# Patient Record
Sex: Male | Born: 1985 | Race: Asian | Hispanic: No | Marital: Married | Smoking: Never smoker
Health system: Southern US, Community
[De-identification: ages and names within clinical notes are randomized; demographics above are authoritative.]

## PROBLEM LIST (undated history)

## (undated) HISTORY — PX: HERNIA REPAIR: SHX51

---

## 2019-08-03 ENCOUNTER — Ambulatory Visit: Payer: Self-pay | Attending: Family

## 2019-08-03 DIAGNOSIS — Z23 Encounter for immunization: Secondary | ICD-10-CM

## 2019-08-03 NOTE — Progress Notes (Signed)
   Covid-19 Vaccination Clinic  Name:  Juan Stanley    MRN: [id number] DOB: 01-Nov-1985  08/03/2019  [name] was observed post Covid-19 immunization for 15 minutes without incident. He was provided with Vaccine Information Sheet and instruction to access the V-Safe system.   [name] was instructed to call 911 with any severe reactions post vaccine: Marland Kitchen Difficulty breathing  . Swelling of face and throat  . A fast heartbeat  . A bad rash all over body  . Dizziness and weakness   Immunizations Administered    Name Date Dose VIS Date Route   Moderna COVID-19 Vaccine 08/03/2019 11:55 AM 0.5 mL 04/04/2019 Intramuscular   Manufacturer: Moderna   Lot: [id number]   NDC: 69629-528-41

## 2019-09-05 ENCOUNTER — Ambulatory Visit: Payer: Self-pay | Attending: Family

## 2019-09-05 DIAGNOSIS — Z23 Encounter for immunization: Secondary | ICD-10-CM

## 2019-09-05 NOTE — Progress Notes (Signed)
   Covid-19 Vaccination Clinic  Name:  Juan Stanley    MRN: [id number] DOB: 02/03/1986  09/05/2019  [name] was observed post Covid-19 immunization for 15 minutes without incident. He was provided with Vaccine Information Sheet and instruction to access the V-Safe system.   [name] was instructed to call 911 with any severe reactions post vaccine: Marland Kitchen Difficulty breathing  . Swelling of face and throat  . A fast heartbeat  . A bad rash all over body  . Dizziness and weakness   Immunizations Administered    Name Date Dose VIS Date Route   Moderna COVID-19 Vaccine 09/05/2019 12:30 PM 0.5 mL 04/2019 Intramuscular   Manufacturer: Moderna   Lot: [id number]   NDC: 62947-654-65

## 2020-07-25 ENCOUNTER — Emergency Department (HOSPITAL_COMMUNITY)
Admission: EM | Admit: 2020-07-25 | Discharge: 2020-07-25 | Disposition: A | Discharge status: Home / Self Care | Payer: BC Managed Care – PPO | Attending: Emergency Medicine | Admitting: Emergency Medicine

## 2020-07-25 ENCOUNTER — Encounter (HOSPITAL_COMMUNITY): Payer: Self-pay

## 2020-07-25 ENCOUNTER — Other Ambulatory Visit: Payer: Self-pay

## 2020-07-25 ENCOUNTER — Emergency Department (HOSPITAL_COMMUNITY): Payer: BC Managed Care – PPO

## 2020-07-25 DIAGNOSIS — K6289 Other specified diseases of anus and rectum: Secondary | ICD-10-CM | POA: Insufficient documentation

## 2020-07-25 LAB — BASIC METABOLIC PANEL
Anion gap: 7 (ref 5–15)
BUN: 11 mg/dL (ref 6–20)
CO2: 27 mmol/L (ref 22–32)
Calcium: 9.3 mg/dL (ref 8.9–10.3)
Chloride: 100 mmol/L (ref 98–111)
Creatinine, Ser: 1 mg/dL (ref 0.61–1.24)
GFR, Estimated: >60 mL/min (ref >60)
Glucose, Bld: 97 mg/dL (ref 70–99)
Potassium: 3.6 mmol/L (ref 3.5–5.1)
Sodium: 134 mmol/L — ABNORMAL LOW (ref 135–145)

## 2020-07-25 LAB — CBC WITH DIFFERENTIAL/PLATELET
Abs Immature Granulocytes: 0.02 10*3/uL (ref 0.00–0.07)
Basophils Absolute: 0 10*3/uL (ref 0.0–0.1)
Basophils Relative: 0 %
Eosinophils Absolute: 0.3 10*3/uL (ref 0.0–0.5)
Eosinophils Relative: 5 %
HCT: 47.1 % (ref 39.0–52.0)
Hemoglobin: 15.8 g/dL (ref 13.0–17.0)
Immature Granulocytes: 0 %
Lymphocytes Relative: 31 %
Lymphs Abs: 2.1 10*3/uL (ref 0.7–4.0)
MCH: 29.9 pg (ref 26.0–34.0)
MCHC: 33.5 g/dL (ref 30.0–36.0)
MCV: 89 fL (ref 80.0–100.0)
Monocytes Absolute: 0.5 10*3/uL (ref 0.1–1.0)
Monocytes Relative: 7 %
Neutro Abs: 3.8 10*3/uL (ref 1.7–7.7)
Neutrophils Relative %: 57 %
Platelets: 295 10*3/uL (ref 150–400)
RBC: 5.29 MIL/uL (ref 4.22–5.81)
RDW: 12.6 % (ref 11.5–15.5)
WBC: 6.8 10*3/uL (ref 4.0–10.5)
nRBC: 0 % (ref 0.0–0.2)

## 2020-07-25 MED ORDER — LIDOCAINE 5 % EX OINT: 1.0000 "application " | TOPICAL_OINTMENT | CUTANEOUS | 0 refills | Status: AC | PRN

## 2020-07-25 MED ORDER — IOHEXOL 300 MG/ML  SOLN
100.0000 mL | Freq: Once | INTRAMUSCULAR | Status: AC | PRN
  Administered 2020-07-25: 100 mL via INTRAVENOUS

## 2020-07-25 MED ORDER — DOXYCYCLINE HYCLATE 100 MG PO CAPS: 100.0000 mg | ORAL_CAPSULE | Freq: Two times a day (BID) | ORAL | 0 refills | Status: AC

## 2020-07-25 NOTE — ED Notes (Signed)
Per [facility] NP, patient with rectal pain for over a week-states possible perirectal abscess

## 2020-07-25 NOTE — Discharge Instructions (Signed)
There was a small area of inflammation noted in the CT of the pelvis. Take medications as prescribed along with a stool softener. Follow-up with your primary care provider. Return to the ER if you start to experience worsening pain, swelling, bloody stools.

## 2020-07-25 NOTE — ED Triage Notes (Signed)
Patient c/o rectal pain x 8 days. 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 Patient states he received a cream, but continues to have hemorrhoids. Patient denies any rectal bleeding.

## 2020-07-25 NOTE — ED Provider Notes (Signed)
[facility]-EMERGENCY DEPT Provider Note   CSN: [id number] Arrival date & time: 07/25/20  1705     History Chief Complaint  Patient presents with  . Rectal Pain    Juan Stanley is a 35 y.o. male who presents to ED with a chief complaint of rectal pain.  States that he has had intermittent less severe rectal pain for the past several years.  The pain will improve spontaneously.  He noticed for the past week that his pain has gotten severe and worse when he has a bowel movement.  He was seen and evaluated at student health center and received a cream and suppositories.  He continues to have pain and was sent to the ER to rule out abscess.  He states that his pain is most severe several hours after having a bowel movement.  He last used the cream just prior to arrival.  He denies any bloody stools, abdominal pain, chest pain, fever, drainage from the area, urinary symptoms.  Denies any chronic medical issues.  HPI     History reviewed. No pertinent past medical history.  There are no problems to display for this patient.   Past Surgical History:  Procedure Laterality Date  . HERNIA REPAIR         Family History  Problem Relation Age of Onset  . Rheum arthritis Mother   . Healthy Father     Social History   Tobacco Use  . Smoking status: Never Smoker  . Smokeless tobacco: Never Used  Vaping Use  . Vaping Use: Never used  Substance Use Topics  . Alcohol use: Yes  . Drug use: Never    Home Medications Prior to Admission medications   Medication Sig Start Date End Date Taking? Authorizing Provider  doxycycline (VIBRAMYCIN) 100 MG capsule Take 1 capsule (100 mg total) by mouth 2 (two) times daily for 7 days. 07/25/20 08/01/20 Yes Jomari Bartnik, PA-C  lidocaine (XYLOCAINE) 5 % ointment Apply 1 application topically as needed. 07/25/20  Yes Areal Cochrane, PA-C    Allergies    Patient has no known allergies.  Review of Systems   Review of  Systems  Constitutional: Negative for appetite change, chills and fever.  HENT: Negative for ear pain, rhinorrhea, sneezing and sore throat.   Eyes: Negative for photophobia and visual disturbance.  Respiratory: Negative for cough, chest tightness, shortness of breath and wheezing.   Cardiovascular: Negative for chest pain and palpitations.  Gastrointestinal: Negative for abdominal pain, blood in stool, constipation, diarrhea, nausea and vomiting.       +rectal pain  Genitourinary: Negative for dysuria, hematuria and urgency.  Musculoskeletal: Negative for myalgias.  Skin: Negative for rash.  Neurological: Negative for dizziness, weakness and light-headedness.    Physical Exam Updated Vital Signs BP (!) 140/91   Pulse 80   Temp 98.2 F (36.8 C)   Resp (!) 25   Ht 5\' 9"  (1.753 m)   Wt 77.1 kg   SpO2 100%   BMI 25.10 kg/m   Physical Exam Vitals and nursing note reviewed. Exam conducted with a chaperone present.  Constitutional:      General: He is not in acute distress.    Appearance: He is well-developed.  HENT:     Head: Normocephalic and atraumatic.     Nose: Nose normal.  Eyes:     General: No scleral icterus.       Left eye: No discharge.     Conjunctiva/sclera: Conjunctivae normal.  Cardiovascular:     Rate and Rhythm: Normal rate and regular rhythm.     Heart sounds: Normal heart sounds. No murmur heard. No friction rub. No gallop.   Pulmonary:     Effort: Pulmonary effort is normal. No respiratory distress.     Breath sounds: Normal breath sounds.  Abdominal:     General: Bowel sounds are normal. There is no distension.     Palpations: Abdomen is soft.     Tenderness: There is no abdominal tenderness. There is no guarding.  Genitourinary:    Comments: No external abnormalities noted.  Patient with severe discomfort when attempting DRE. I was unable to fully palpate rectal vault. RN as chaperone at the bedside. Musculoskeletal:        General: Normal range  of motion.     Cervical back: Normal range of motion and neck supple.  Skin:    General: Skin is warm and dry.     Findings: No rash.  Neurological:     Mental Status: He is alert.     Motor: No abnormal muscle tone.     Coordination: Coordination normal.     ED Results / Procedures / Treatments   Labs (all labs ordered are listed, but only abnormal results are displayed) Labs Reviewed  BASIC METABOLIC PANEL - Abnormal; Notable for the following components:      Result Value   Sodium 134 (*)    All other components within normal limits  CBC WITH DIFFERENTIAL/PLATELET    EKG None  Radiology CT PELVIS W CONTRAST  Result Date: 07/25/2020 CLINICAL DATA:  Perirectal pain EXAM: CT PELVIS WITH CONTRAST TECHNIQUE: Multidetector CT imaging of the pelvis was performed using the standard protocol following the bolus administration of intravenous contrast. CONTRAST:  OMNIPAQUE IOHEXOL 300 MG/ML  SOLN COMPARISON:  None. FINDINGS: Urinary Tract: Urinary bladder is midline with wall thickness within normal limits. No lesion seen by CT in the distributions of the mid to distal ureters. Bowel: No appreciable bowel wall thickening. There is moderate stool in the rectum and sigmoid colon. No bowel obstruction evident. Terminal ileum appears normal. Appendix appears normal. No evident free air in the lower abdomen or pelvis. Vascular/Lymphatic: Pelvic vascular structures appear normal. No evident adenopathy in the lower abdomen or pelvis. Reproductive: Prostate and seminal vesicles are normal in size and contour. Other: There is subtle soft tissue thickening to the left of the rectum which is appreciable on axial images but not well delineated on sagittal and coronal reformats. This area of asymmetric thickening measures 2.0 x 1.6 cm on axial images. There is no fluid collection or well-defined abscess. There is no appreciable soft tissue stranding apart from this localized thickening on the left.  Elsewhere in the lower abdomen and pelvis, there is no ascites or abscess. There is fat in the left inguinal ring. Musculoskeletal: No blastic or lytic bone lesions. No fracture or dislocation. No intramuscular lesions. IMPRESSION: 1. No rectal wall thickening. No well-defined abscess. On axial images, there is subtle soft tissue asymmetry slightly to the left of the rectum without associated fluid. This finding is not confirmed on coronal and sagittal images and is of questionable significance. Early inflammation in this area is possible. The area of asymmetry seen on axial images measures 2.0 x 1.6 cm. This asymmetry is noted on axial slices 35 and 36 series 2. 2. No other soft tissue asymmetry noted. No evident bowel wall thickening or bowel obstruction. No abscess in the lower abdomen  or pelvis. The appendix appears normal. 3.  Fat in the left inguinal ring. 4.  Study otherwise unremarkable. Electronically Signed   By: Bretta Bang III M.D.   On: 07/25/2020 19:14    Procedures Procedures   Medications Ordered in ED Medications  iohexol (OMNIPAQUE) 300 MG/ML solution 100 mL (100 mLs Intravenous Contrast Given 07/25/20 1852)    ED Course  I have reviewed the triage vital signs and the nursing notes.  Pertinent labs & imaging results that were available during my care of the patient were reviewed by me and considered in my medical decision making (see chart for details).  Clinical Course as of 07/25/20 1929  Thu Jul 25, 2020  1836 WBC: 6.8 [HK]  1836 Creatinine: 1.00 [HK]    Clinical Course User Index [HK] Dietrich Pates, PA-C   MDM Rules/Calculators/A&P                          35 year old male presenting to the ED with a chief complaint of rectal pain.  Has been treating for hemorrhoids for the past week after being seen at student health center.  However his pain worsened today and he states that it is significantly worse after he has a bowel movement.  He was sent to the ER to rule  out possible abscess.  He reports severe pain with attempting DRE.  He is afebrile here.  Denies any bloody stools, abdominal pain, fever or drainage.  Will obtain lab work and CT of the pelvis.  Lab work is reassuring.  CT of the pelvis with possible inflammation of the left of the rectum without evidence of abscess or other abnormality.  Due to patient's persistent symptoms and exquisite tenderness, will treat with antibiotics after having shared decision-making discussion.  We will also give him lidocaine to help with discomfort.  He was advised to continue taking his stool softener.  Return precautions given.   Patient is hemodynamically stable, in NAD, and able to ambulate in the ED. Evaluation does not show pathology that would require ongoing emergent intervention or inpatient treatment. I explained the diagnosis to the patient. Pain has been managed and has no complaints prior to discharge. Patient is comfortable with above plan and is stable for discharge at this time. All questions were answered prior to disposition. Strict return precautions for returning to the ED were discussed. Encouraged follow up with PCP.   An After Visit Summary was printed and given to the patient.   Portions of this note were generated with Scientist, clinical (histocompatibility and immunogenetics). Dictation errors may occur despite best attempts at proofreading.  Final Clinical Impression(s) / ED Diagnoses Final diagnoses:  Rectal pain    Rx / DC Orders ED Discharge Orders         Ordered    lidocaine (XYLOCAINE) 5 % ointment  As needed        07/25/20 1928    doxycycline (VIBRAMYCIN) 100 MG capsule  2 times daily        07/25/20 1928           Dietrich Pates, [place] 07/25/20 1929    Linwood Dibbles, MD 07/26/20 1506

## 2020-07-31 ENCOUNTER — Telehealth: Payer: Self-pay | Admitting: Internal Medicine

## 2020-07-31 NOTE — Telephone Encounter (Signed)
Patient seen at [facility] [employer] [facility] because of rectal pain, abscess seems to have been ruled out by CT scan in the ER but he has persistent symptoms.  Student health requesting appointment for this patient.  I have an opening Thursday, March 31 at 930  Call him and see if he can do that let me know if not we will look for another spot

## 2020-08-01 NOTE — Telephone Encounter (Signed)
Patient has been scheduled to see Willette Cluster RNP on 08/05/20 1:30.  Offered earlier appointment , but he declined

## 2020-08-02 ENCOUNTER — Other Ambulatory Visit: Payer: Self-pay

## 2020-08-05 ENCOUNTER — Ambulatory Visit: Payer: BC Managed Care – PPO | Admitting: Nurse Practitioner

## 2020-08-05 NOTE — Progress Notes (Deleted)
     ASSESSMENT AND PLAN      HISTORY OF PRESENT ILLNESS     Chief Complaint :  Juan Stanley is a 35 y.o. male known to Dr. Marland Kitchen with a past medical history significant for  Data Reviewed:   PREVIOUS EVALUATIONS:    No past medical history on file.   Past Surgical History:  Procedure Laterality Date  . HERNIA REPAIR     Family History  Problem Relation Age of Onset  . Rheum arthritis Mother   . Healthy Father    Social History   Tobacco Use  . Smoking status: Never Smoker  . Smokeless tobacco: Never Used  Vaping Use  . Vaping Use: Never used  Substance Use Topics  . Alcohol use: Yes  . Drug use: Never   Current Outpatient Medications  Medication Sig Dispense Refill  . ANUSOL-HC 25 MG suppository Insert 1 suppository into rectum twice a day as needed for pain    . hydrocortisone (ANUSOL-HC) 2.5 % rectal cream USE AS DIRECTED VIA APPLICATOR TWICE A DAY    . lidocaine (XYLOCAINE) 5 % ointment Apply 1 application topically as needed. 35.44 g 0   No current facility-administered medications for this visit.   No Known Allergies   Review of Systems: Positive for ***.  All other systems reviewed and negative except where noted in HPI.   PHYSICAL EXAM :    Wt Readings from Last 3 Encounters:  07/25/20 170 lb (77.1 kg)    There were no vitals taken for this visit. Constitutional:  Pleasant ***male in no acute distress. Psychiatric: Normal mood and affect. Behavior is normal. EENT: Pupils normal.  Conjunctivae are normal. No scleral icterus. Neck supple.  Cardiovascular: Normal rate, regular rhythm. No edema Pulmonary/chest: Effort normal and breath sounds normal. No wheezing, rales or rhonchi. Abdominal: Soft, nondistended, nontender. Bowel sounds active throughout. There are no masses palpable. No hepatomegaly. Neurological: Alert and oriented to person place and time. Skin: Skin is warm and dry. No rashes noted.  Willette Cluster, NP  08/05/2020,  1:30 PM

## 2020-09-10 ENCOUNTER — Ambulatory Visit: Payer: BC Managed Care – PPO | Attending: Internal Medicine

## 2020-09-10 DIAGNOSIS — Z20822 Contact with and (suspected) exposure to covid-19: Secondary | ICD-10-CM

## 2020-09-11 LAB — NOVEL CORONAVIRUS, NAA: SARS-CoV-2, NAA: NOT DETECTED

## 2020-09-11 LAB — SARS-COV-2, NAA 2 DAY TAT

## 2021-10-31 IMAGING — CT CT PELVIS W/ CM
2 of 4 series · 16 of 46 positions shown, 18 images · IV contrast (omnipaque)
Comparison: None.

CLINICAL DATA: Perirectal pain

EXAM:
CT PELVIS WITH CONTRAST
TECHNIQUE: Multidetector CT imaging of the pelvis was performed using the
standard protocol following the bolus administration of intravenous
contrast.
CONTRAST:  100mL OMNIPAQUE IOHEXOL 300 MG/ML  SOLN

[Series 2: axial st · axial · 0.92mm/px · z∈[+960,+1260]mm · 13 of 70 slices shown, 15 images]
[im 5/70  soft-tissue]
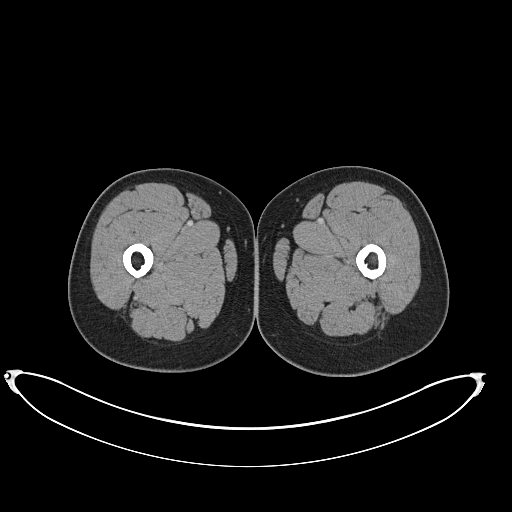
[im 5/70  bone]
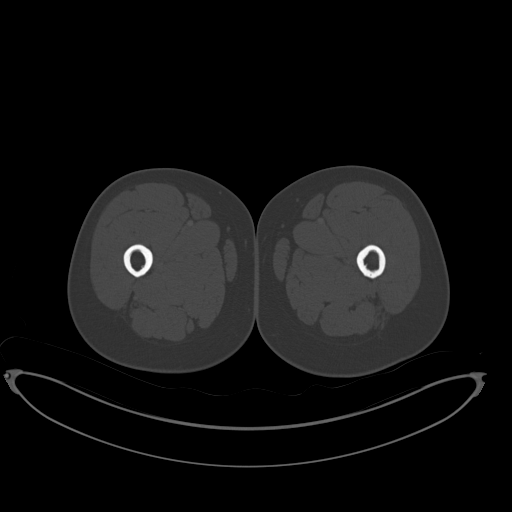
[im 9/70  soft-tissue]
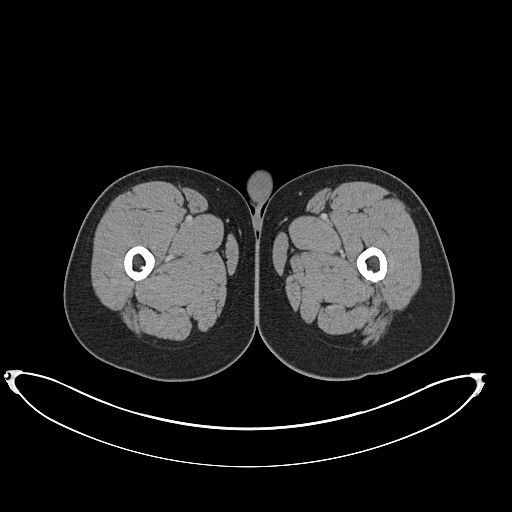
[im 14/70  soft-tissue]
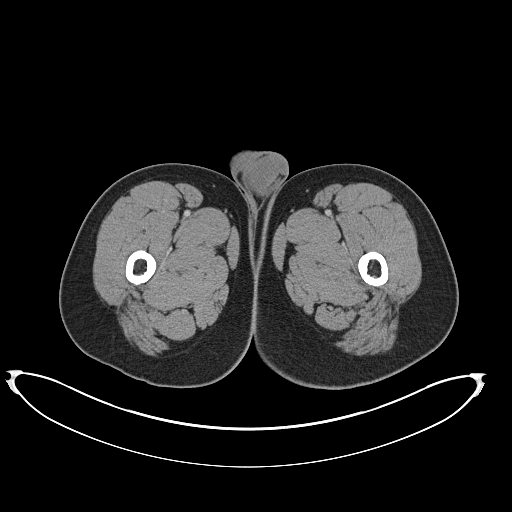
[im 21/70  soft-tissue]
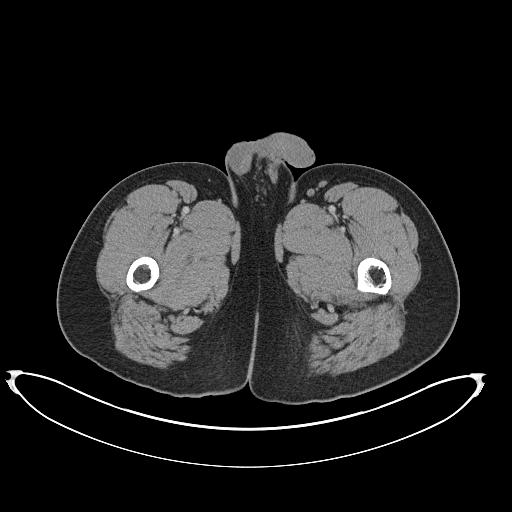
[im 25/70  soft-tissue]
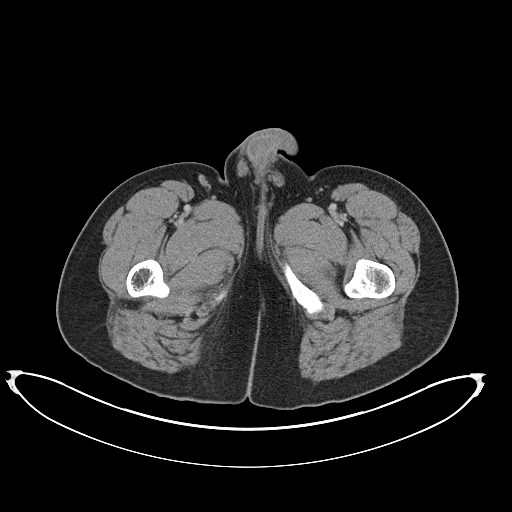
[im 29/70  soft-tissue]
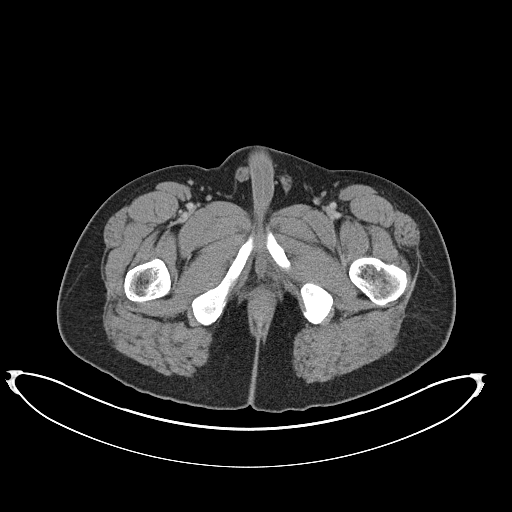
[im 36/70  soft-tissue]
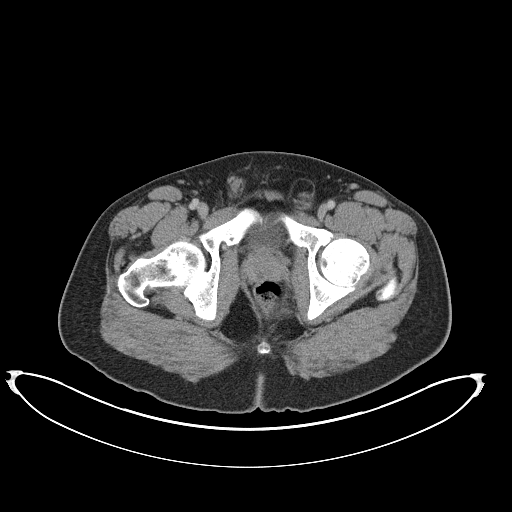
[im 41/70  soft-tissue]
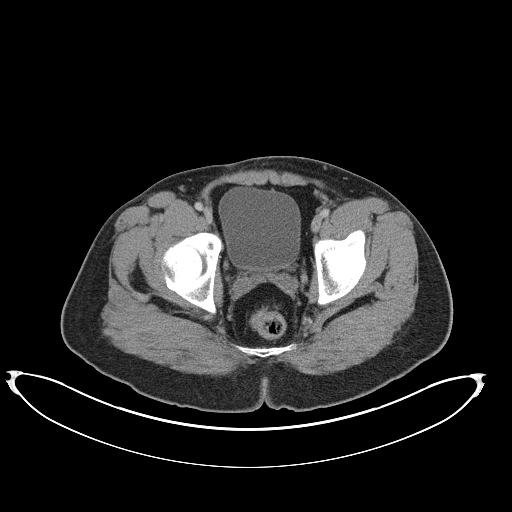
[im 45/70  soft-tissue]
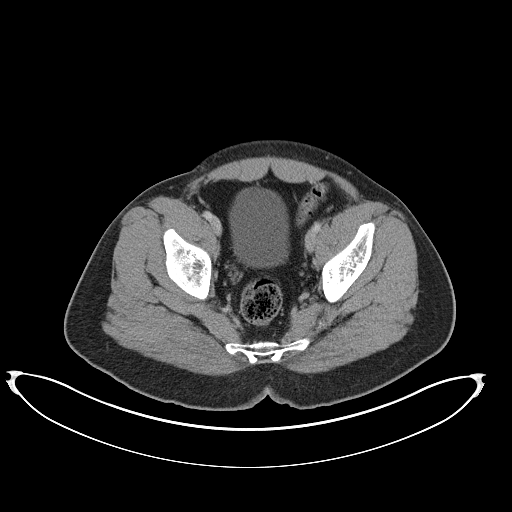
[im 45/70  bone]
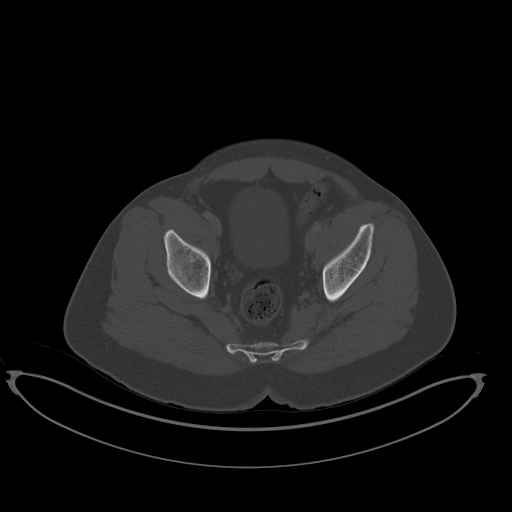
[im 49/70  soft-tissue]
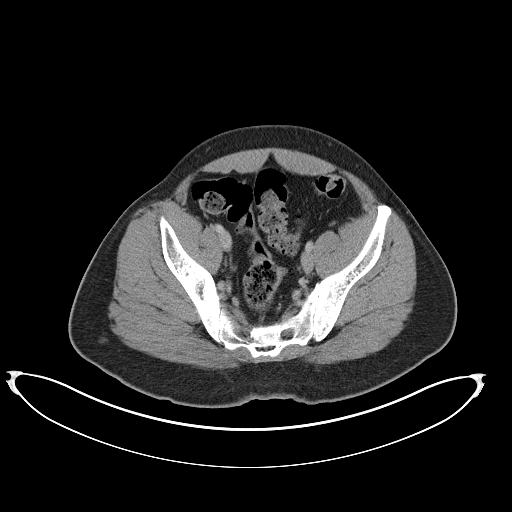
[im 56/70  soft-tissue]
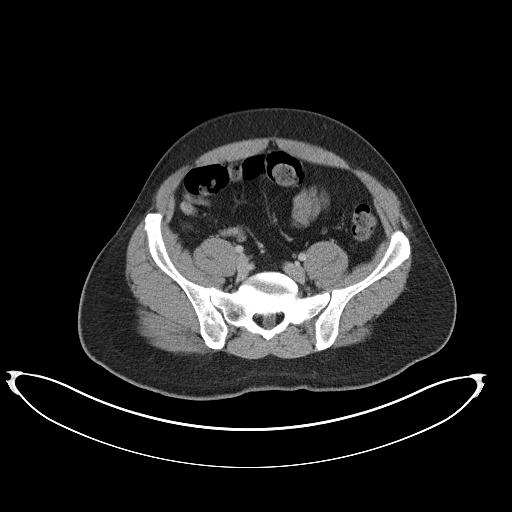
[im 61/70  soft-tissue]
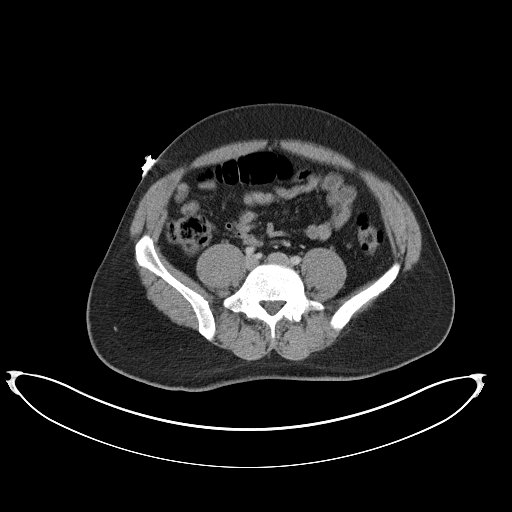
[im 65/70  soft-tissue]
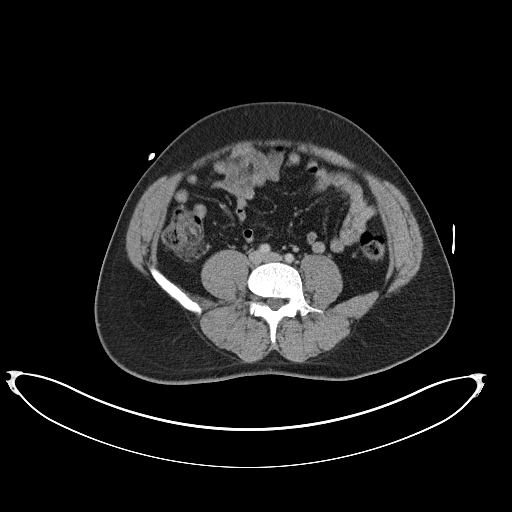

[Series 8: coronal st · coronal · 0.74mm/px · 3 of 161 slices shown]
[im 33/161  soft-tissue]
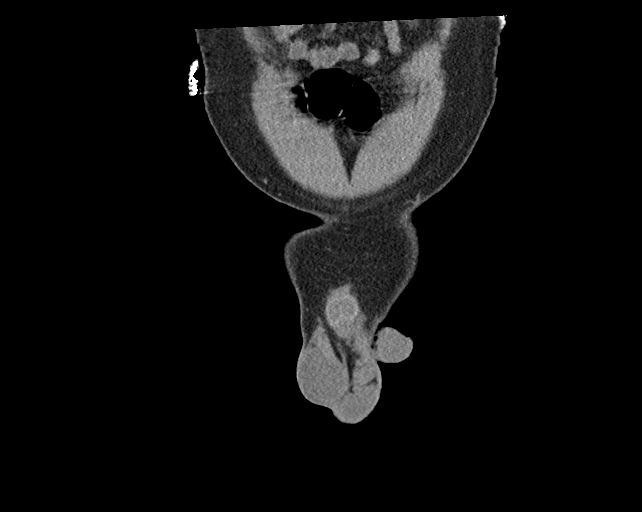
[im 65/161  soft-tissue]
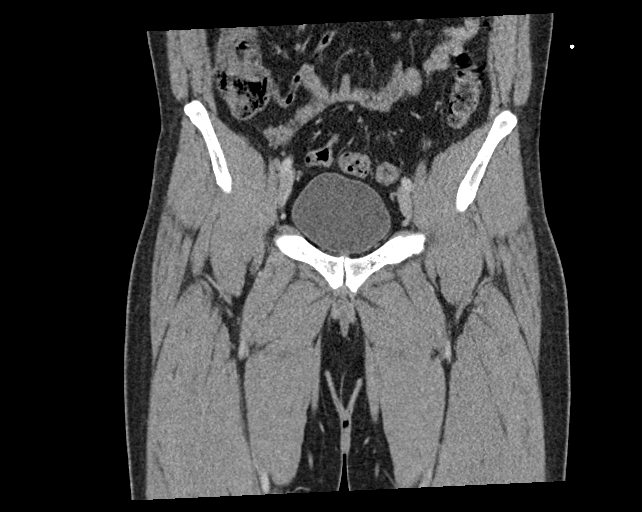
[im 97/161  soft-tissue]
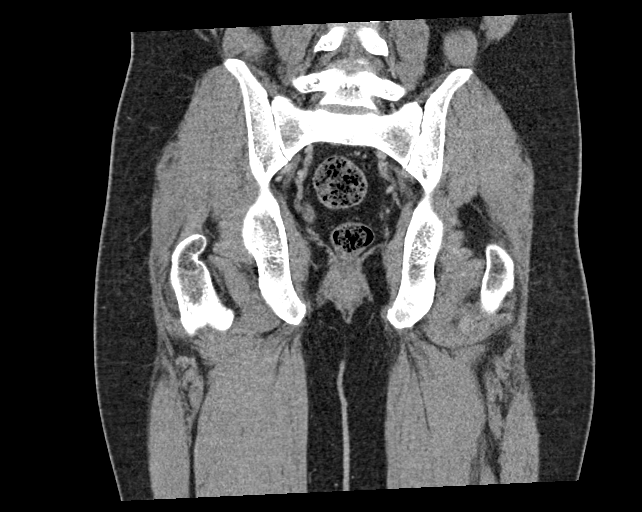

[16 of 46 positions shown; findings below may reference images not displayed]

FINDINGS: Urinary Tract: Urinary bladder is midline with wall thickness within
normal limits. No lesion seen by CT in the distributions of the mid
to distal ureters.

Bowel: No appreciable bowel wall thickening. There is moderate stool
in the rectum and sigmoid colon. No bowel obstruction evident.
Terminal ileum appears normal. Appendix appears normal. No evident
free air in the lower abdomen or pelvis.

Vascular/Lymphatic: Pelvic vascular structures appear normal. No
evident adenopathy in the lower abdomen or pelvis.

Reproductive: Prostate and seminal vesicles are normal in size and
contour.

Other: There is subtle soft tissue thickening to the left of the
rectum which is appreciable on axial images but not well delineated
on sagittal and coronal reformats. This area of asymmetric
thickening measures 2.0 x 1.6 cm on axial images. There is no fluid
collection or well-defined abscess. There is no appreciable soft
tissue stranding apart from this localized thickening on the left.

Elsewhere in the lower abdomen and pelvis, there is no ascites or
abscess. There is fat in the left inguinal ring.

Musculoskeletal: No blastic or lytic bone lesions. No fracture or
dislocation. No intramuscular lesions.
IMPRESSION: 1. No rectal wall thickening. No well-defined abscess. On axial
images, there is subtle soft tissue asymmetry slightly to the left
of the rectum without associated fluid. This finding is not
confirmed on coronal and sagittal images and is of questionable
significance. Early inflammation in this area is possible. The area
of asymmetry seen on axial images measures 2.0 x 1.6 cm. This
asymmetry is noted on axial slices 35 and 36 series [DATE]. No other soft tissue asymmetry noted. No evident bowel wall
thickening or bowel obstruction. No abscess in the lower abdomen or
pelvis. The appendix appears normal.

3.  Fat in the left inguinal ring.

4.  Study otherwise unremarkable.

## 2023-12-24 ENCOUNTER — Encounter: Payer: Self-pay | Admitting: Gastroenterology

## 2024-03-17 ENCOUNTER — Ambulatory Visit: Admitting: Gastroenterology
